# Patient Record
Sex: Female | Born: 1980 | Race: White | Hispanic: No | Marital: Single | Smoking: Former smoker
Health system: Southern US, Community
[De-identification: ages and names within clinical notes are randomized; demographics above are authoritative.]

## PROBLEM LIST (undated history)

## (undated) DIAGNOSIS — Z789 Other specified health status: Secondary | ICD-10-CM

## (undated) HISTORY — PX: LEEP: SHX91

---

## 2018-09-19 ENCOUNTER — Other Ambulatory Visit: Payer: Self-pay

## 2018-09-19 ENCOUNTER — Emergency Department
Admission: EM | Admit: 2018-09-19 | Discharge: 2018-09-19 | Disposition: A | Discharge status: Home / Self Care | Payer: Medicaid Other | Attending: Student in an Organized Health Care Education/Training Program | Admitting: Student in an Organized Health Care Education/Training Program

## 2018-09-19 ENCOUNTER — Encounter: Payer: Self-pay | Admitting: Emergency Medicine

## 2018-09-19 DIAGNOSIS — K648 Other hemorrhoids: Secondary | ICD-10-CM | POA: Diagnosis not present

## 2018-09-19 DIAGNOSIS — F17200 Nicotine dependence, unspecified, uncomplicated: Secondary | ICD-10-CM | POA: Diagnosis not present

## 2018-09-19 DIAGNOSIS — K625 Hemorrhage of anus and rectum: Secondary | ICD-10-CM | POA: Diagnosis not present

## 2018-09-19 DIAGNOSIS — K6289 Other specified diseases of anus and rectum: Secondary | ICD-10-CM | POA: Diagnosis present

## 2018-09-19 MED ORDER — HYDROCORTISONE ACETATE 25 MG RE SUPP: 25.0000 mg | Freq: Two times a day (BID) | RECTAL | 0 refills | Status: DC

## 2018-09-19 MED ORDER — LIDOCAINE HCL URETHRAL/MUCOSAL 2 % EX GEL: 1.0000 "application " | CUTANEOUS | 0 refills | Status: AC | PRN

## 2018-09-19 NOTE — ED Provider Notes (Signed)
[facility] Emergency Department Provider Note ____________________________________________   First MD Initiated Contact with Patient 09/19/18 0720     (approximate)  I have reviewed the triage vital signs and the nursing notes.   HISTORY  Chief Complaint Rectal Pain  HPI Molly Becker is a 38 y.o. female who presents to the emergency department for treatment and evaluation of rectal pain.  Patient states that she has a history of external hemorrhoids and has used over-the-counter medications without relief.  Last bowel movement was yesterday.  Pain increases with bowel movement.  She does notice some streaks of blood on the toilet tissue when wiping.  Pain is preventing her from being able to sleep.         History reviewed. No pertinent past medical history.  There are no active problems to display for this patient.   Past Surgical History:  Procedure Laterality Date  . LEEP      Prior to Admission medications   Medication Sig Start Date End Date Taking? Authorizing Provider  hydrocortisone (ANUSOL-HC) 25 MG suppository Place 1 suppository (25 mg total) rectally 2 (two) times daily. 09/19/18   Sereena Marando B, FNP  lidocaine (XYLOCAINE) 2 % jelly Apply 1 application topically as needed for up to 14 days. 09/19/18 10/03/18  Kem Boroughsriplett, Keri Tavella B, FNP    Allergies Penicillins  No family history on file.  Social History Social History   Tobacco Use  . Smoking status: Current Every Day Smoker  . Smokeless tobacco: Never Used  Substance Use Topics  . Alcohol use: Not Currently    Frequency: Never  . Drug use: Not on file    Review of Systems  Constitutional: No fever/chills Eyes: No visual changes. ENT: No sore throat. Cardiovascular: Denies chest pain. Respiratory: Denies shortness of breath. Gastrointestinal: No abdominal pain.  No nausea, no vomiting.  No diarrhea.  No constipation.  Positive for rectal pain Genitourinary: Negative for  dysuria. Musculoskeletal: Negative for back pain. Skin: Negative for rash. Neurological: Negative for headaches, focal weakness or numbness. ____________________________________________   PHYSICAL EXAM:  VITAL SIGNS: ED Triage Vitals [09/19/18 0714]  Enc Vitals Group     BP 124/78     Pulse Rate 92     Resp 14     Temp 98 F (36.7 C)     Temp Source Oral     SpO2 97 %     Weight      Height      Head Circumference      Peak Flow      Pain Score 9     Pain Loc      Pain Edu?      Excl. in GC?     Constitutional: Alert and oriented. Well appearing and in no acute distress. Eyes: Conjunctivae are normal. Head: Atraumatic. Nose: No congestion/rhinnorhea. Mouth/Throat: Mucous membranes are moist.  Oropharynx non-erythematous. Neck: No stridor.   Cardiovascular:  Good peripheral circulation. Respiratory: Normal respiratory effort.  No retractions. Gastrointestinal: Soft and nontender. No distention. No abdominal bruits. No CVA tenderness.  Internal hemorrhoids noted on DRE.  No blood. Genitourinary:  Musculoskeletal: No lower extremity tenderness nor edema.  No joint effusions. Neurologic:  Normal speech and language. No gross focal neurologic deficits are appreciated. No gait instability. Skin:  Skin is warm, dry and intact. No rash noted. Psychiatric: Mood and affect are normal. Speech and behavior are normal.  ____________________________________________   LABS (all labs ordered are listed, but only abnormal results  are displayed)  Labs Reviewed - No data to display ____________________________________________  EKG  Not indicated ____________________________________________  RADIOLOGY  ED MD interpretation:    Not indicated  Official radiology report(s): No results found.  ____________________________________________   PROCEDURES  Procedure(s) performed (including Critical Care):  Procedures  ____________________________________________    INITIAL IMPRESSION / ASSESSMENT AND PLAN     38 year old female presenting to the emergency department for treatment and evaluation of internal hemorrhoids.  She will be given a prescription for Anusol HC and lidocaine gel.  She was encouraged to follow-up with her primary care provider or see gastroenterology for symptoms that are not improving with medication.  She was also advised that she should take MiraLAX or Metamucil daily.  She was advised to return to the emergency department for symptoms of concern if unable to schedule an appointment with either of the above.  ____________________________________________   FINAL CLINICAL IMPRESSION(S) / ED DIAGNOSES  Final diagnoses:  Internal hemorrhoids     ED Discharge Orders         Ordered    hydrocortisone (ANUSOL-HC) 25 MG suppository  2 times daily     09/19/18 0807    lidocaine (XYLOCAINE) 2 % jelly  As needed     09/19/18 8110           Note:  This document was prepared using Dragon voice recognition software and may include unintentional dictation errors.   Victorino Dike, FNP 09/19/18 3159    Merlyn Lot, MD 09/19/18 1428

## 2018-09-19 NOTE — Discharge Instructions (Signed)
Please follow up with gastroenterologist or primary care for symptoms that are not improving with time and medications.  Use MiraLax or Metamucil daily.  Return to the ER for symptoms that change or worsen if unable to see the specialist or PCP.

## 2018-09-19 NOTE — ED Notes (Signed)
See triage note  States she developed rectal pain for several days  Became worse over the past 4 days

## 2018-09-19 NOTE — ED Triage Notes (Addendum)
Says she has pain inside rectum.  Has had hemorrhoids, and she has been having a hard time going to the bathroom for quite a while.  Pain increased with bowel movment.  Has tried prep h and a&d ointment

## 2018-11-20 ENCOUNTER — Other Ambulatory Visit: Payer: Self-pay

## 2018-11-20 DIAGNOSIS — Z20822 Contact with and (suspected) exposure to covid-19: Secondary | ICD-10-CM

## 2018-11-22 LAB — NOVEL CORONAVIRUS, NAA: SARS-CoV-2, NAA: DETECTED — AB

## 2019-04-22 ENCOUNTER — Other Ambulatory Visit: Payer: Self-pay

## 2019-04-22 ENCOUNTER — Emergency Department: Payer: No Typology Code available for payment source

## 2019-04-22 ENCOUNTER — Emergency Department
Admission: EM | Admit: 2019-04-22 | Discharge: 2019-04-22 | Disposition: A | Discharge status: Home / Self Care | Payer: No Typology Code available for payment source | Attending: Emergency Medicine | Admitting: Emergency Medicine

## 2019-04-22 DIAGNOSIS — Z79899 Other long term (current) drug therapy: Secondary | ICD-10-CM | POA: Diagnosis not present

## 2019-04-22 DIAGNOSIS — Y990 Civilian activity done for income or pay: Secondary | ICD-10-CM | POA: Insufficient documentation

## 2019-04-22 DIAGNOSIS — Y92511 Restaurant or cafe as the place of occurrence of the external cause: Secondary | ICD-10-CM | POA: Insufficient documentation

## 2019-04-22 DIAGNOSIS — S8002XA Contusion of left knee, initial encounter: Secondary | ICD-10-CM | POA: Diagnosis not present

## 2019-04-22 DIAGNOSIS — F17210 Nicotine dependence, cigarettes, uncomplicated: Secondary | ICD-10-CM | POA: Diagnosis not present

## 2019-04-22 DIAGNOSIS — Z23 Encounter for immunization: Secondary | ICD-10-CM | POA: Diagnosis not present

## 2019-04-22 DIAGNOSIS — W19XXXA Unspecified fall, initial encounter: Secondary | ICD-10-CM

## 2019-04-22 DIAGNOSIS — Y939 Activity, unspecified: Secondary | ICD-10-CM | POA: Insufficient documentation

## 2019-04-22 DIAGNOSIS — W010XXA Fall on same level from slipping, tripping and stumbling without subsequent striking against object, initial encounter: Secondary | ICD-10-CM | POA: Insufficient documentation

## 2019-04-22 DIAGNOSIS — S80912A Unspecified superficial injury of left knee, initial encounter: Secondary | ICD-10-CM | POA: Diagnosis present

## 2019-04-22 MED ORDER — BACITRACIN ZINC 500 UNIT/GM EX OINT: TOPICAL_OINTMENT | Freq: Once | CUTANEOUS | Status: AC

## 2019-04-22 MED ORDER — MELOXICAM 15 MG PO TABS: 15.0000 mg | ORAL_TABLET | Freq: Every day | ORAL | 2 refills | Status: DC

## 2019-04-22 MED ORDER — TETANUS-DIPHTH-ACELL PERTUSSIS 5-2.5-18.5 LF-MCG/0.5 IM SUSP
0.5000 mL | Freq: Once | INTRAMUSCULAR | Status: AC
  Administered 2019-04-22: 0.5 mL via INTRAMUSCULAR
  Filled 2019-04-22: qty 0.5

## 2019-04-22 NOTE — ED Triage Notes (Signed)
Pt to ED POV for chief complaint of fall on 3/14. 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 States she skinned her left knee. No swelling noted to knee at this time.  Pt states it "feels like something is loose in there" Pt will be filing workers comp.

## 2019-04-22 NOTE — Discharge Instructions (Signed)
Keep the abrasion clean and dry Wear the knee immobilizer until evaluated by orthopedics Apply ice to the left knee Take meloxicam as prescribed Return emergency department if worsening Follow-up with orthopedics by calling for an appointment.  Talk with your HR representative to make sure your Worker's Comp. will approve this physician.  If not they should refer you to another orthopedic doctor.

## 2019-04-22 NOTE — ED Provider Notes (Signed)
[facility] Emergency Department Provider Note  ____________________________________________   First MD Initiated Contact with Patient 04/22/19 1757     (approximate)  I have reviewed the triage vital signs and the nursing notes.   HISTORY  Chief Complaint Fall    HPI Molly Becker is a 39 y.o. female presents emergency department complaining of left knee pain.  Patient states she was at work at [employer] and fell on 04/20/2019.  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  She was trying to catch a bus cart which also flipped over and she fell skinning her left knee.  Complaining of continued pain to the left knee and "feels like something is loose inside ".  She states it was difficult to finish her shift today due to the amount of pain.  She denies any numbness or tingling.  No other injuries reported.    History reviewed. No pertinent past medical history.  There are no problems to display for this patient.   Past Surgical History:  Procedure Laterality Date  . LEEP      Prior to Admission medications   Medication Sig Start Date End Date Taking? Authorizing Provider  hydrocortisone (ANUSOL-HC) 25 MG suppository Place 1 suppository (25 mg total) rectally 2 (two) times daily. 09/19/18   Triplett, Johnette Abraham B, FNP  meloxicam (MOBIC) 15 MG tablet Take 1 tablet (15 mg total) by mouth daily. 04/22/19 04/21/20  Versie Starks, PA-C    Allergies Penicillins  History reviewed. No pertinent family history.  Social History Social History   Tobacco Use  . Smoking status: Current Every Day Smoker  . Smokeless tobacco: Never Used  Substance Use Topics  . Alcohol use: Not Currently  . Drug use: Not on file    Review of Systems  Constitutional: No fever/chills Eyes: No visual changes. ENT: No sore throat. Respiratory: Denies cough Cardiovascular: Denies chest pain Gastrointestinal: Denies abdominal pain Genitourinary: Negative for  dysuria. Musculoskeletal: Negative for back pain.  Positive for left knee pain Skin: Negative for rash.  Positive for abrasion to the left knee Psychiatric: no mood changes,     ____________________________________________   PHYSICAL EXAM:  VITAL SIGNS: ED Triage Vitals  Enc Vitals Group     BP 04/22/19 1755 129/83     Pulse Rate 04/22/19 1755 90     Resp 04/22/19 1755 18     Temp --      Temp src --      SpO2 04/22/19 1755 98 %     Weight 04/22/19 1756 230 lb (104.3 kg)     Height 04/22/19 1756 5\' 11"  (1.803 m)     Head Circumference --      Peak Flow --      Pain Score 04/22/19 1756 7     Pain Loc --      Pain Edu? --      Excl. in [place]? --     Constitutional: Alert and oriented. Well appearing and in no acute distress. Eyes: Conjunctivae are normal.  Head: Atraumatic. Nose: No congestion/rhinnorhea. Mouth/Throat: Mucous membranes are moist.   Neck:  supple no lymphadenopathy noted Cardiovascular: Normal rate, regular rhythm.  Respiratory: Normal respiratory effort.  No retractions,  GU: deferred Musculoskeletal: FROM all extremities, warm and well perfused, patient is able to ambulate but does limp, left knee is tender at the joint line, large abrasion noted at the patella, no foreign body noted, ligaments appear to be intact, patient is guarding so is difficult  to tell.  No posterior knee tenderness. Neurologic:  Normal speech and language.  Skin:  Skin is warm, dry, multiple abrasions to the left knee, no rash noted. Psychiatric: Mood and affect are normal. Speech and behavior are normal.  ____________________________________________   LABS (all labs ordered are listed, but only abnormal results are displayed)  Labs Reviewed - No data to display ____________________________________________   ____________________________________________  RADIOLOGY  X-ray of the left knee is  negative  ____________________________________________   PROCEDURES  Procedure(s) performed: Knee immobilizer and crutches   Procedures    ____________________________________________   INITIAL IMPRESSION / ASSESSMENT AND PLAN / ED COURSE  Pertinent labs & imaging results that were available during my care of the patient were reviewed by me and considered in my medical decision making (see chart for details).   Patient's 39 year old female presents emergency department complaining of a left knee injury fall at work.  See HPI  Physical exam shows patient to have some left knee tenderness along with a large abrasion noted along the patella and joint line.  X-ray of the left knee Tdap updated   X-ray left knee is negative for fracture.  Explained findings to the patient.  Do feel that she needs to be in a knee immobilizer for 1 to 2 weeks.  If after that she continues to have knee pain she will need to be evaluated by orthopedics for possible meniscus tear.  She was given a prescription for meloxicam.  She is to follow-up with critical clinic orthopedics unless her Worker's Comp. does not approve this physician.  Worker's Comp. should refer her to a physician of their choice.  Return emergency department worsening.  Apply ice to the knee.  Light duty was given.  She was discharged stable condition.  Molly Becker was evaluated in Emergency Department on 04/22/2019 for the symptoms described in the history of present illness. She was evaluated in the context of the global COVID-19 pandemic, which necessitated consideration that the patient might be at risk for infection with the SARS-CoV-2 virus that causes COVID-19. Institutional protocols and algorithms that pertain to the evaluation of patients at risk for COVID-19 are in a state of rapid change based on information released by regulatory bodies including the CDC and federal and state organizations. These policies and algorithms were  followed during the patient's care in the ED.   As part of my medical decision making, I reviewed the following data within the electronic MEDICAL RECORD NUMBER Nursing notes reviewed and incorporated, Old chart reviewed, Radiograph reviewed , Notes from prior ED visits and [place] Controlled Substance Database  ____________________________________________   FINAL CLINICAL IMPRESSION(S) / ED DIAGNOSES  Final diagnoses:  Fall, initial encounter  Contusion of left knee, initial encounter      NEW MEDICATIONS STARTED DURING THIS VISIT:  New Prescriptions   MELOXICAM (MOBIC) 15 MG TABLET    Take 1 tablet (15 mg total) by mouth daily.     Note:  This document was prepared using Dragon voice recognition software and may include unintentional dictation errors.    Faythe Ghee, PA-C 04/22/19 1857    Darci Current, MD 04/23/19 2053

## 2019-05-02 ENCOUNTER — Ambulatory Visit: Payer: Medicaid Other | Admitting: Dermatology

## 2019-07-03 ENCOUNTER — Ambulatory Visit: Payer: Medicaid Other | Admitting: Dermatology

## 2020-03-23 ENCOUNTER — Other Ambulatory Visit: Payer: Self-pay | Admitting: Family Medicine

## 2020-03-23 DIAGNOSIS — Z1231 Encounter for screening mammogram for malignant neoplasm of breast: Secondary | ICD-10-CM

## 2020-03-29 ENCOUNTER — Ambulatory Visit (INDEPENDENT_AMBULATORY_CARE_PROVIDER_SITE_OTHER): Payer: Medicaid Other

## 2020-03-29 ENCOUNTER — Encounter: Payer: Self-pay | Admitting: Podiatry

## 2020-03-29 ENCOUNTER — Ambulatory Visit (INDEPENDENT_AMBULATORY_CARE_PROVIDER_SITE_OTHER): Payer: Medicaid Other | Admitting: Podiatry

## 2020-03-29 ENCOUNTER — Other Ambulatory Visit: Payer: Self-pay

## 2020-03-29 DIAGNOSIS — S91331A Puncture wound without foreign body, right foot, initial encounter: Secondary | ICD-10-CM

## 2020-03-29 DIAGNOSIS — L603 Nail dystrophy: Secondary | ICD-10-CM

## 2020-03-29 DIAGNOSIS — Q828 Other specified congenital malformations of skin: Secondary | ICD-10-CM | POA: Diagnosis not present

## 2020-03-29 NOTE — Progress Notes (Signed)
  Subjective:  Patient ID: Molly Becker, female    DOB: Nov 22, 1980,  MRN: [id number]  Chief Complaint  Patient presents with  . Callouses  . Nail Problem    Patient presents today for nail fungus bilat great toenails and 2nd nails x years and painful callous lesion bottom of left 1st met.    40 y.o. female presents with the above complaint. History confirmed with patient.  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  Had some recurrent infections and drain pus for a while but finally closed up.  Has been painful since.  She also previously was on Lamisil for nail fungus but stopped due to issues with her alcohol use at the time.  She does not recall exactly how long she was taking it but says it was probably not long enough.  She usually has acrylic nails put on to cover the dystrophic toenails  Objective:  Physical Exam: warm, good capillary refill, no trophic changes or ulcerative lesions, normal DP and PT pulses and normal sensory exam.  Bilaterally with right worse than left she has dystrophic first and second toenails.  Thickening and subungual debris noted.  Submet 1 on the right foot there is hyperkeratosis without evidence of sinus tract or puncture wound.  No retained foreign body visible.  Radiographs: X-ray of the right foot: Skin marker present in the site of concern, there is no retained foreign body or osseous abnormality Assessment:   1. Puncture wound of plantar aspect of right foot, initial encounter   2. Porokeratosis   3. Nail dystrophy      Plan:  Patient was evaluated and treated and all questions answered.  All symptomatic hyperkeratoses were safely debrided with a sterile #15 blade to patient's level of comfort without incident. We discussed preventative and palliative care of these lesions including supportive and accommodative shoegear, padding, prefabricated and custom molded  accommodative orthoses, use of a pumice stone and lotions/creams daily.  Recommended she use urea cream and or salicylic acid.  She will use a pumice stone daily.  Aperture pads recommended as well and she will get these.  For the nail dystrophy I took a small sample of the nail and we will send this for nail culture.  If it comes back with fungus we will treat accordingly.  Discussed that if the nail dystrophy is permanent and are not able to treat it that permanent nail avulsion would be an option or she can continue to put on acrylics to cover it..    Return in about 6 weeks (around 05/10/2020).

## 2020-03-29 NOTE — Patient Instructions (Signed)
Look for urea 40% cream or ointment and apply to the thickened dry skin / calluses. This can be bought over the counter, at a pharmacy or online such as [employer].  Salicylic acid also works well  Aperture pads can be found on [employer] to take pressure off the hard spot

## 2020-04-13 ENCOUNTER — Encounter: Payer: Self-pay | Admitting: *Deleted

## 2020-05-03 ENCOUNTER — Ambulatory Visit
Admission: RE | Admit: 2020-05-03 | Discharge: 2020-05-03 | Disposition: A | Discharge status: Home / Self Care | Payer: Medicaid Other | Source: Ambulatory Visit | Attending: Family Medicine | Admitting: Family Medicine

## 2020-05-03 ENCOUNTER — Other Ambulatory Visit: Payer: Self-pay

## 2020-05-03 DIAGNOSIS — Z1231 Encounter for screening mammogram for malignant neoplasm of breast: Secondary | ICD-10-CM | POA: Diagnosis present

## 2020-05-10 ENCOUNTER — Ambulatory Visit: Payer: Medicaid Other | Admitting: Podiatry

## 2020-05-24 ENCOUNTER — Ambulatory Visit: Payer: Medicaid Other | Admitting: Podiatry

## 2021-01-10 ENCOUNTER — Ambulatory Visit: Payer: Medicaid Other

## 2021-04-11 ENCOUNTER — Other Ambulatory Visit: Payer: Self-pay | Admitting: Family Medicine

## 2021-04-11 DIAGNOSIS — Z1231 Encounter for screening mammogram for malignant neoplasm of breast: Secondary | ICD-10-CM

## 2021-05-23 ENCOUNTER — Ambulatory Visit
Admission: RE | Admit: 2021-05-23 | Discharge: 2021-05-23 | Disposition: A | Discharge status: Home / Self Care | Payer: Medicaid Other | Source: Ambulatory Visit | Attending: Family Medicine | Admitting: Family Medicine

## 2021-05-23 DIAGNOSIS — Z1231 Encounter for screening mammogram for malignant neoplasm of breast: Secondary | ICD-10-CM | POA: Diagnosis present

## 2022-01-19 ENCOUNTER — Emergency Department
Admission: EM | Admit: 2022-01-19 | Discharge: 2022-01-19 | Disposition: A | Discharge status: Home / Self Care | Payer: Medicaid Other | Attending: Student in an Organized Health Care Education/Training Program | Admitting: Student in an Organized Health Care Education/Training Program

## 2022-01-19 ENCOUNTER — Other Ambulatory Visit: Payer: Self-pay

## 2022-01-19 DIAGNOSIS — R42 Dizziness and giddiness: Secondary | ICD-10-CM | POA: Insufficient documentation

## 2022-01-19 DIAGNOSIS — R109 Unspecified abdominal pain: Secondary | ICD-10-CM | POA: Diagnosis not present

## 2022-01-19 DIAGNOSIS — R1110 Vomiting, unspecified: Secondary | ICD-10-CM | POA: Insufficient documentation

## 2022-01-19 DIAGNOSIS — R197 Diarrhea, unspecified: Secondary | ICD-10-CM | POA: Diagnosis present

## 2022-01-19 LAB — COMPREHENSIVE METABOLIC PANEL
ALT: 27 U/L (ref 0–44)
AST: 20 U/L (ref 15–41)
Albumin: 3.8 g/dL (ref 3.5–5.0)
Alkaline Phosphatase: 57 U/L (ref 38–126)
Anion gap: 6 (ref 5–15)
BUN: 11 mg/dL (ref 6–20)
CO2: 25 mmol/L (ref 22–32)
Calcium: 9.8 mg/dL (ref 8.9–10.3)
Chloride: 108 mmol/L (ref 98–111)
Creatinine, Ser: 0.71 mg/dL (ref 0.44–1.00)
GFR, Estimated: >60 mL/min (ref >60)
Glucose, Bld: 104 mg/dL — ABNORMAL HIGH (ref 70–99)
Potassium: 3.8 mmol/L (ref 3.5–5.1)
Sodium: 139 mmol/L (ref 135–145)
Total Bilirubin: 0.5 mg/dL (ref 0.3–1.2)
Total Protein: 7.5 g/dL (ref 6.5–8.1)

## 2022-01-19 LAB — LIPASE, BLOOD: Lipase: 49 U/L (ref 11–51)

## 2022-01-19 LAB — URINALYSIS, ROUTINE W REFLEX MICROSCOPIC
Bilirubin Urine: NEGATIVE
Glucose, UA: NEGATIVE mg/dL
Hgb urine dipstick: NEGATIVE
Ketones, ur: NEGATIVE mg/dL
Leukocytes,Ua: NEGATIVE
Nitrite: NEGATIVE
Protein, ur: NEGATIVE mg/dL
Specific Gravity, Urine: 1.02 (ref 1.005–1.030)
pH: 5 (ref 5.0–8.0)

## 2022-01-19 LAB — CBC
HCT: 41.7 % (ref 36.0–46.0)
Hemoglobin: 13.2 g/dL (ref 12.0–15.0)
MCH: 29.3 pg (ref 26.0–34.0)
MCHC: 31.7 g/dL (ref 30.0–36.0)
MCV: 92.7 fL (ref 80.0–100.0)
Platelets: 328 10*3/uL (ref 150–400)
RBC: 4.5 MIL/uL (ref 3.87–5.11)
RDW: 13.7 % (ref 11.5–15.5)
WBC: 6.9 10*3/uL (ref 4.0–10.5)
nRBC: 0 % (ref 0.0–0.2)

## 2022-01-19 LAB — POC URINE PREG, ED: Preg Test, Ur: NEGATIVE

## 2022-01-19 MED ORDER — LOPERAMIDE HCL 2 MG PO TABS: 2.0000 mg | ORAL_TABLET | Freq: Four times a day (QID) | ORAL | 0 refills | Status: DC | PRN

## 2022-01-19 MED ORDER — ONDANSETRON 4 MG PO TBDP: 4.0000 mg | ORAL_TABLET | Freq: Three times a day (TID) | ORAL | 0 refills | Status: DC | PRN

## 2022-01-19 MED ORDER — DICYCLOMINE HCL 10 MG PO CAPS: 10.0000 mg | ORAL_CAPSULE | Freq: Three times a day (TID) | ORAL | 0 refills | Status: DC

## 2022-01-19 NOTE — ED Provider Notes (Signed)
[facility] Provider Note  Patient Contact: 5:53 PM (approximate)   History   Abdominal Pain   HPI  Molly Becker is a 41 y.o. female who presents emergency department complaining of abdominal cramping, diarrhea.  Patient states that she has been having diarrhea for 2 days.  She states that she will get abdominal cramping right before the episodes of diarrhea.  During abdominal cramping and diarrhea she states that she gets lightheaded feeling when she is going to pass out.  She has not had any syncope.  States that since she has diarrhea her abdomen feels better, lightheaded sensation goes away and she feels "normal.  "No fevers or chills.  She is not nauseated several times with diarrhea and 1 episode of emesis.  No blood in the diarrhea.  No blood in the vomit.  Patient has no chronic GI complaints.  She states that she has no abdominal pain other than the cramping sensation when she is having the actual episodes of diarrhea on the commode.     Physical Exam   Triage Vital Signs: ED Triage Vitals  Enc Vitals Group     BP 01/19/22 1625 124/79     Pulse Rate 01/19/22 1620 87     Resp 01/19/22 1620 16     Temp 01/19/22 1620 97.6 F (36.4 C)     Temp Source 01/19/22 1620 Oral     SpO2 01/19/22 1620 100 %     Weight 01/19/22 1623 250 lb (113.4 kg)     Height 01/19/22 1623 5\' 11"  (1.803 m)     Head Circumference --      Peak Flow --      Pain Score 01/19/22 1622 7     Pain Loc --      Pain Edu? --      Excl. in GC? --     Most recent vital signs: Vitals:   01/19/22 1620 01/19/22 1625  BP:  124/79  Pulse: 87   Resp: 16   Temp: 97.6 F (36.4 C)   SpO2: 100%      General: Alert and in no acute distress.  Cardiovascular:  Good peripheral perfusion Respiratory: Normal respiratory effort without tachypnea or retractions. Lungs CTAB.  Gastrointestinal: Bowel sounds 4 quadrants. Soft and nontender to palpation. No guarding or rigidity. No palpable  masses. No distention. No CVA tenderness. Musculoskeletal: Full range of motion to all extremities.  Neurologic:  No gross focal neurologic deficits are appreciated.  Skin:   No rash noted Other:   ED Results / Procedures / Treatments   Labs (all labs ordered are listed, but only abnormal results are displayed) Labs Reviewed  COMPREHENSIVE METABOLIC PANEL - Abnormal; Notable for the following components:      Result Value   Glucose, Bld 104 (*)    All other components within normal limits  URINALYSIS, ROUTINE W REFLEX MICROSCOPIC - Abnormal; Notable for the following components:   Color, Urine YELLOW (*)    APPearance HAZY (*)    All other components within normal limits  LIPASE, BLOOD  CBC  POC URINE PREG, ED     EKG     RADIOLOGY    No results found.  PROCEDURES:  Critical Care performed: No  Procedures   MEDICATIONS ORDERED IN ED: Medications - No data to display   IMPRESSION / MDM / ASSESSMENT AND PLAN / ED COURSE  I reviewed the triage vital signs and the nursing notes.  Differential diagnosis includes, but is not limited to, viral gastroenteritis, flu, presents, colitis, cholecystitis, parotitis, UTI  Patient's presentation is most consistent with acute presentation with potential threat to life or bodily function.   Patient's diagnosis is consistent with diarrhea, gastroenteritis.  Patient presents emergency department with ongoing diarrhea x 2 days.  She states that she gets abdominal cramping with the diarrhea but is not having pain at baseline.  There is been no fevers.  She had 1 episode of emesis while she was having diarrhea.  No ongoing emesis.  Patient has a reassuring physical exam with no tenderness.  Suspect viral gastroenteritis.  Labs are reassuring with no elevation white blood cell count, lipase is reassuring.  Urinalysis is reassuring.  At this time I have offered imaging but patient prefers to control  medication and states that she will return if symptoms worsen.  I feel this is reasonable given benign exam, reassuring labs and the fact that patient is only experiencing symptoms during her periods of diarrhea.  Short-term cautions given.  Patient also controlled medications prescribed at this time.  Follow-up primary care as needed..  Patient is given ED precautions to return to the ED for any worsening or new symptoms.        FINAL CLINICAL IMPRESSION(S) / ED DIAGNOSES   Final diagnoses:  Diarrhea of presumed infectious origin     Rx / DC Orders   ED Discharge Orders          Ordered    ondansetron (ZOFRAN-ODT) 4 MG disintegrating tablet  Every 8 hours PRN        01/19/22 1758    dicyclomine (BENTYL) 10 MG capsule  3 times daily before meals & bedtime        01/19/22 1758    loperamide (IMODIUM A-D) 2 MG tablet  4 times daily PRN        01/19/22 1758             Note:  This document was prepared using Dragon voice recognition software and may include unintentional dictation errors.   Lanette Hampshire 01/19/22 1759    Willy Eddy, MD 01/19/22 1929

## 2022-01-19 NOTE — ED Triage Notes (Signed)
Pt states woke up Tuesday morning around 0500 started having diarrhea, then felt nauseated and broke out in a cold sweat. Pt states felt like she was going to pass out. Has been ongoing since. Pt states some abdominal cramping pain.

## 2022-03-14 ENCOUNTER — Ambulatory Visit
Admission: EM | Admit: 2022-03-14 | Discharge: 2022-03-14 | Disposition: A | Discharge status: Home / Self Care | Payer: Medicaid Other | Attending: Emergency Medicine | Admitting: Emergency Medicine

## 2022-03-14 DIAGNOSIS — J0100 Acute maxillary sinusitis, unspecified: Secondary | ICD-10-CM | POA: Diagnosis not present

## 2022-03-14 MED ORDER — CLARITHROMYCIN 500 MG PO TABS: 500.0000 mg | ORAL_TABLET | Freq: Two times a day (BID) | ORAL | 0 refills | Status: AC

## 2022-03-14 NOTE — Discharge Instructions (Signed)
Take the antibiotic as directed.  Follow up with your primary care provider if your symptoms are not improving.     

## 2022-03-14 NOTE — ED Provider Notes (Signed)
Roderic Palau    CSN: [id number] Arrival date & time: 03/14/22  1142      History   Chief Complaint Chief Complaint  Patient presents with   Eye Problem    HPI Molly Becker is a 42 y.o. female.  Patient presents with 1 week history of fatigue, headache, sinus pressure, congestion, runny nose, cough, yellow nasal mucous, watery eyes.  Treatment with Dayquil, Nyquil, allergy medication.  No fever, rash, sore throat, shortness of breath, vomiting, diarrhea, or other symptoms.  No pertinent medical history.   The history is provided by the patient and medical records.    History reviewed. No pertinent past medical history.  There are no problems to display for this patient.   Past Surgical History:  Procedure Laterality Date   LEEP      OB History   No obstetric history on file.      Home Medications    Prior to Admission medications   Medication Sig Start Date End Date Taking? Authorizing Provider  clarithromycin (BIAXIN) 500 MG tablet Take 1 tablet (500 mg total) by mouth 2 (two) times daily for 10 days. 03/14/22 03/24/22 Yes Sharion Balloon, NP  dicyclomine (BENTYL) 10 MG capsule Take 1 capsule (10 mg total) by mouth 4 (four) times daily -  before meals and at bedtime. 01/19/22   Cuthriell, Charline Bills, PA-C  loperamide (IMODIUM A-D) 2 MG tablet Take 1 tablet (2 mg total) by mouth 4 (four) times daily as needed for diarrhea or loose stools. 01/19/22   Cuthriell, Charline Bills, PA-C  ondansetron (ZOFRAN-ODT) 4 MG disintegrating tablet Take 1 tablet (4 mg total) by mouth every 8 (eight) hours as needed. 01/19/22   Cuthriell, Charline Bills, PA-C  phentermine 37.5 MG capsule Take 37.5 mg by mouth every morning. 03/22/20   [provider]    Family History Family History  Problem Relation Age of Onset   Breast cancer Neg Hx     Social History Social History   Tobacco Use   Smoking status: Former    Types: Cigarettes   Smokeless tobacco: Never  Vaping Use    Vaping Use: Never used  Substance Use Topics   Alcohol use: Not Currently     Allergies   Penicillins   Review of Systems Review of Systems  Constitutional:  Positive for fatigue. Negative for chills and fever.  HENT:  Positive for congestion, postnasal drip, rhinorrhea and sinus pressure. Negative for ear pain and sore throat.   Respiratory:  Positive for cough. Negative for shortness of breath.   Cardiovascular:  Negative for chest pain and palpitations.  Gastrointestinal:  Negative for diarrhea and vomiting.  Skin:  Negative for color change and rash.  Neurological:  Positive for headaches.  All other systems reviewed and are negative.    Physical Exam Triage Vital Signs ED Triage Vitals [03/14/22 1218]  Enc Vitals Group     BP      Pulse Rate 83     Resp 18     Temp 97.7 F (36.5 C)     Temp src      SpO2 97 %     Weight      Height      Head Circumference      Peak Flow      Pain Score      Pain Loc      Pain Edu?      Excl. in [place]?    No data found.  Updated Vital Signs BP 119/78   Pulse 83   Temp 97.7 F (36.5 C)   Resp 18   LMP 02/13/2022   SpO2 97%   Visual Acuity Right Eye Distance:   Left Eye Distance:   Bilateral Distance:    Right Eye Near:   Left Eye Near:    Bilateral Near:     Physical Exam Vitals and nursing note reviewed.  Constitutional:      General: She is not in acute distress.    Appearance: She is well-developed. She is not ill-appearing.  HENT:     Right Ear: Tympanic membrane normal.     Left Ear: Tympanic membrane normal.     Nose: Congestion and rhinorrhea present.     Mouth/Throat:     Mouth: Mucous membranes are moist.     Pharynx: Oropharynx is clear.  Eyes:     Extraocular Movements: Extraocular movements intact.     Conjunctiva/sclera: Conjunctivae normal.     Pupils: Pupils are equal, round, and reactive to light.  Cardiovascular:     Rate and Rhythm: Normal rate and regular rhythm.     Heart sounds:  Normal heart sounds.  Pulmonary:     Effort: Pulmonary effort is normal. No respiratory distress.     Breath sounds: Normal breath sounds.  Musculoskeletal:     Cervical back: Neck supple.  Skin:    General: Skin is warm and dry.  Neurological:     Mental Status: She is alert.  Psychiatric:        Mood and Affect: Mood normal.        Behavior: Behavior normal.      UC Treatments / Results  Labs (all labs ordered are listed, but only abnormal results are displayed) Labs Reviewed - No data to display  EKG   Radiology No results found.  Procedures Procedures (including critical care time)  Medications Ordered in UC Medications - No data to display  Initial Impression / Assessment and Plan / UC Course  I have reviewed the triage vital signs and the nursing notes.  Pertinent labs & imaging results that were available during my care of the patient were reviewed by me and considered in my medical decision making (see chart for details).    Acute sinusitis.  Patient has been symptomatic for 1 week and is not improving with OTC treatment.  She has an allergy to PCN; last taken at approximately age 60 and had hives.  Treating today with clarithromycin.  Discussed symptomatic treatment including Tylenol, rest, hydration.  Instructed patient to follow up with her PCP if symptoms are not improving.  She agrees to plan of care.   Final Clinical Impressions(s) / UC Diagnoses   Final diagnoses:  Acute non-recurrent maxillary sinusitis     Discharge Instructions      Take the antibiotic as directed.  Follow up with your primary care provider if your symptoms are not improving.        ED Prescriptions     Medication Sig Dispense Auth. Provider   clarithromycin (BIAXIN) 500 MG tablet Take 1 tablet (500 mg total) by mouth 2 (two) times daily for 10 days. 20 tablet Sharion Balloon, NP      PDMP not reviewed this encounter.   Sharion Balloon, NP 03/14/22 1245

## 2022-03-14 NOTE — ED Triage Notes (Signed)
Patient to Urgent Care with complaints of headache, cough, fatigue/ low energy, watery eyes x1 week.  Denies any known fevers.   Taking nyquil/ dayqil/ allergy medications.

## 2022-04-10 ENCOUNTER — Telehealth: Payer: Self-pay

## 2022-04-10 ENCOUNTER — Other Ambulatory Visit: Payer: Self-pay | Admitting: Family Medicine

## 2022-04-10 DIAGNOSIS — Z1231 Encounter for screening mammogram for malignant neoplasm of breast: Secondary | ICD-10-CM

## 2022-04-10 NOTE — Telephone Encounter (Signed)
Patient left a voicemail about make a appointment from the referral we received from her PCP. Returned patient call and made her a new patient appointment for 06/21/2022

## 2022-05-25 ENCOUNTER — Ambulatory Visit
Admission: RE | Admit: 2022-05-25 | Discharge: 2022-05-25 | Disposition: A | Discharge status: Home / Self Care | Payer: Medicaid Other | Source: Ambulatory Visit | Attending: Family Medicine | Admitting: Family Medicine

## 2022-05-25 DIAGNOSIS — Z1231 Encounter for screening mammogram for malignant neoplasm of breast: Secondary | ICD-10-CM | POA: Diagnosis present

## 2022-06-21 ENCOUNTER — Ambulatory Visit (INDEPENDENT_AMBULATORY_CARE_PROVIDER_SITE_OTHER): Payer: Medicaid Other | Admitting: Gastroenterology

## 2022-06-21 ENCOUNTER — Other Ambulatory Visit: Payer: Self-pay

## 2022-06-21 ENCOUNTER — Encounter: Payer: Self-pay | Admitting: Gastroenterology

## 2022-06-21 VITALS — BP 133/88 | HR 96 | Temp 97.9°F | Ht 71.0 in | Wt 251.8 lb

## 2022-06-21 DIAGNOSIS — K625 Hemorrhage of anus and rectum: Secondary | ICD-10-CM

## 2022-06-21 DIAGNOSIS — K5909 Other constipation: Secondary | ICD-10-CM

## 2022-06-21 MED ORDER — NA SULFATE-K SULFATE-MG SULF 17.5-3.13-1.6 GM/177ML PO SOLN: 354.0000 mL | Freq: Once | ORAL | 0 refills | Status: AC

## 2022-06-21 NOTE — Patient Instructions (Signed)
Gave Linzess 290 samples.

## 2022-06-21 NOTE — Progress Notes (Signed)
Arlyss Repress, MD [address]  [address]  [place], [place] [address]  Main: [phone]  Fax: [phone]    Gastroenterology Consultation  Referring Provider:     Center, [facility]* Primary Care Physician:  Center, [facility] Primary Gastroenterologist:  Dr. Arlyss Repress Reason for Consultation: Chronic constipation, rectal bleeding        HPI:   Molly Becker is a 42 y.o. female referred by Center, [facility]  for consultation & management of chronic constipation, rectal bleeding.  Patient had history of alcohol dependence, has been sober for last 5 years, went to deaddiction program.  She reports that over the last several years, she has been experiencing severe constipation and has been laxative dependent.  She has to take at least 2-3 laxatives in order to have a bowel movement.  She has been taking equal and 2 MiraLAX over-the-counter and has been taking fiber pills daily.  She cannot have a bowel movement if not using laxatives and which can last for more than a week.  Reports abdominal bloating as well.  Does report intermittent bright red blood per rectum.  She states that she is trying to eat healthy, does consume red meat regularly, works at [employer] that her gut issues have started since her second pregnancy, at age of 29  NSAIDs: None  Antiplts/Anticoagulants/Anti thrombotics: None  GI Procedures: None  No past medical history on file.  Past Surgical History:  Procedure Laterality Date   LEEP       Current Outpatient Medications:    Na Sulfate-K Sulfate-Mg Sulf 17.5-3.13-1.6 GM/177ML SOLN, Take 354 mLs by mouth once for 1 dose., Disp: 354 mL, Rfl: 0   Family History  Problem Relation Age of Onset   Breast cancer Neg Hx      Social History   Tobacco Use   Smoking status: Former    Types: Cigarettes   Smokeless tobacco: Never  Vaping Use   Vaping Use: Never used  Substance Use Topics   Alcohol  use: Not Currently    Allergies as of 06/21/2022 - Review Complete 06/21/2022  Allergen Reaction Noted   Penicillins Hives 09/19/2018    Review of Systems:    All systems reviewed and negative except where noted in HPI.   Physical Exam:  BP 133/88   Pulse 96   Temp 97.9 F (36.6 C) (Oral)   Ht 5\' 11"  (1.803 m)   Wt 251 lb 12.8 oz (114.2 kg)   LMP 05/08/2022   BMI 35.12 kg/m  Patient's last menstrual period was 05/08/2022.  General:   Alert,  Well-developed, well-nourished, pleasant and cooperative in NAD Head:  Normocephalic and atraumatic. Eyes:  Sclera clear, no icterus.   Conjunctiva pink. Ears:  Normal auditory acuity. Nose:  No deformity, discharge, or lesions. Mouth:  No deformity or lesions,oropharynx pink & moist. Neck:  Supple; no masses or thyromegaly. Lungs:  Respirations even and unlabored.  Clear throughout to auscultation.   No wheezes, crackles, or rhonchi. No acute distress. Heart:  Regular rate and rhythm; no murmurs, clicks, rubs, or gallops. Abdomen:  Normal bowel sounds. Soft, non-tender and non-distended without masses, hepatosplenomegaly or hernias noted.  No guarding or rebound tenderness.   Rectal: Not performed Msk:  Symmetrical without gross deformities. Good, equal movement & strength bilaterally. Pulses:  Normal pulses noted. Extremities:  No clubbing or edema.  No cyanosis. Neurologic:  Alert and oriented x3;  grossly normal neurologically. Skin:  Intact without significant lesions or rashes. No jaundice. Psych:  Alert and cooperative. Normal mood and affect.  Imaging Studies: None  Assessment and Plan:   Molly Becker is a 42 y.o. female with history of chronic constipation, past history of alcohol dependence, has been sober for last 5 years, rectal bleeding  Chronic constipation Discussed about high-fiber diet, healthy lifestyle, information provided Trial of Linzess 290 mcg daily, samples provided  Rectal bleeding Recommend  colonoscopy with 2-day prep   Follow up with Celso Amy, PA-C in 3 months   Arlyss Repress, MD

## 2022-06-22 ENCOUNTER — Encounter: Payer: Self-pay | Admitting: Gastroenterology

## 2022-06-29 ENCOUNTER — Encounter
Admission: RE | Disposition: A | Discharge status: Home / Self Care | Payer: Self-pay | Source: Home / Self Care | Attending: Gastroenterology

## 2022-06-29 ENCOUNTER — Telehealth: Payer: Self-pay

## 2022-06-29 ENCOUNTER — Ambulatory Visit: Payer: Medicaid Other | Admitting: Anesthesiology

## 2022-06-29 ENCOUNTER — Ambulatory Visit
Admission: RE | Admit: 2022-06-29 | Discharge: 2022-06-29 | Disposition: A | Discharge status: Home / Self Care | Payer: Medicaid Other | Attending: Gastroenterology | Admitting: Gastroenterology

## 2022-06-29 ENCOUNTER — Encounter: Payer: Self-pay | Admitting: Gastroenterology

## 2022-06-29 ENCOUNTER — Other Ambulatory Visit: Payer: Self-pay

## 2022-06-29 DIAGNOSIS — K644 Residual hemorrhoidal skin tags: Secondary | ICD-10-CM | POA: Insufficient documentation

## 2022-06-29 DIAGNOSIS — K625 Hemorrhage of anus and rectum: Secondary | ICD-10-CM | POA: Diagnosis present

## 2022-06-29 DIAGNOSIS — Z87891 Personal history of nicotine dependence: Secondary | ICD-10-CM | POA: Diagnosis not present

## 2022-06-29 HISTORY — PX: COLONOSCOPY WITH PROPOFOL: SHX5780

## 2022-06-29 HISTORY — DX: Other specified health status: Z78.9

## 2022-06-29 LAB — POCT PREGNANCY, URINE: Preg Test, Ur: NEGATIVE

## 2022-06-29 SURGERY — COLONOSCOPY WITH PROPOFOL
Anesthesia: Choice | Site: Rectum

## 2022-06-29 MED ORDER — PROPOFOL 10 MG/ML IV BOLUS
INTRAVENOUS | Status: DC | PRN
  Administered 2022-06-29: 20 mg via INTRAVENOUS
  Administered 2022-06-29: 100 mg via INTRAVENOUS

## 2022-06-29 MED ORDER — SODIUM CHLORIDE 0.9 % IV SOLN: INTRAVENOUS | Status: DC

## 2022-06-29 MED ORDER — LIDOCAINE HCL (CARDIAC) PF 100 MG/5ML IV SOSY
PREFILLED_SYRINGE | INTRAVENOUS | Status: DC | PRN
  Administered 2022-06-29: 20 mg via INTRAVENOUS

## 2022-06-29 MED ORDER — PROPOFOL 500 MG/50ML IV EMUL
INTRAVENOUS | Status: DC | PRN
  Administered 2022-06-29: 125 ug/kg/min via INTRAVENOUS

## 2022-06-29 MED ORDER — LACTATED RINGERS IV SOLN: INTRAVENOUS | Status: DC

## 2022-06-29 MED ORDER — LINACLOTIDE 290 MCG PO CAPS: 290.0000 ug | ORAL_CAPSULE | Freq: Every day | ORAL | 5 refills | Status: AC

## 2022-06-29 MED ORDER — STERILE WATER FOR IRRIGATION IR SOLN
Status: DC | PRN
  Administered 2022-06-29: 50 mL

## 2022-06-29 SURGICAL SUPPLY — 6 items
GOWN CVR UNV OPN BCK APRN NK (MISCELLANEOUS) ×2 IMPLANT
GOWN ISOL THUMB LOOP REG UNIV (MISCELLANEOUS) ×2
KIT PRC NS LF DISP ENDO (KITS) ×1 IMPLANT
KIT PROCEDURE OLYMPUS (KITS) ×1
MANIFOLD NEPTUNE II (INSTRUMENTS) ×1 IMPLANT
WATER STERILE IRR 250ML POUR (IV SOLUTION) ×1 IMPLANT

## 2022-06-29 NOTE — H&P (Signed)
  Arlyss Repress, MD [address]  [address]  [place], [place] [address]  Main: [phone]  Fax: [phone] Pager: [phone]  Primary Care Physician:  Lorn Junes, FNP Primary Gastroenterologist:  Dr. Arlyss Repress  Pre-Procedure History & Physical: HPI:  Molly Becker is a 42 y.o. female is here for an colonoscopy.   Past Medical History:  Diagnosis Date   Medical history non-contributory     Past Surgical History:  Procedure Laterality Date   LEEP      Prior to Admission medications   Not on File    Allergies as of 06/21/2022 - Review Complete 06/21/2022  Allergen Reaction Noted   Penicillins Hives 09/19/2018    Family History  Problem Relation Age of Onset   Breast cancer Neg Hx     Social History   Socioeconomic History   Marital status: Single    Spouse name: Not on file   Number of children: Not on file   Years of education: Not on file   Highest education level: Not on file  Occupational History   Not on file  Tobacco Use   Smoking status: Former    Types: Cigarettes   Smokeless tobacco: Never  Vaping Use   Vaping Use: Never used  Substance and Sexual Activity   Alcohol use: Not Currently   Drug use: Not on file   Sexual activity: Not on file  Other Topics Concern   Not on file  Social History Narrative   Not on file   Social Determinants of Health   Financial Resource Strain: Not on file  Food Insecurity: Not on file  Transportation Needs: Not on file  Physical Activity: Not on file  Stress: Not on file  Social Connections: Not on file  Intimate Partner Violence: Not on file    Review of Systems: See HPI, otherwise negative ROS  Physical Exam: BP 100/73   Pulse 76   Temp 98.1 F (36.7 C) (Temporal)   Resp 18   Ht 5\' 11"  (1.803 m)   Wt 112 kg   SpO2 97%   BMI 34.45 kg/m  General:   Alert,  pleasant and cooperative in NAD Head:  Normocephalic and atraumatic. Neck:  Supple; no masses or  thyromegaly. Lungs:  Clear throughout to auscultation.    Heart:  Regular rate and rhythm. Abdomen:  Soft, nontender and nondistended. Normal bowel sounds, without guarding, and without rebound.   Neurologic:  Alert and  oriented x4;  grossly normal neurologically.  Impression/Plan: Molly Becker is here for an colonoscopy to be performed for rectal bleeding  Risks, benefits, limitations, and alternatives regarding  colonoscopy have been reviewed with the patient.  Questions have been answered.  All parties agreeable.   Lannette Donath, MD  06/29/2022, 8:17 AM

## 2022-06-29 NOTE — Anesthesia Preprocedure Evaluation (Signed)
Anesthesia Evaluation  Patient identified by MRN, date of birth, ID band Patient awake    Reviewed: Allergy & Precautions, H&P , NPO status , Patient's Chart, lab work & pertinent test results  Airway Mallampati: I  TM Distance: >3 FB Neck ROM: Full    Dental no notable dental hx.    Pulmonary neg pulmonary ROS, former smoker   Pulmonary exam normal breath sounds clear to auscultation       Cardiovascular negative cardio ROS Normal cardiovascular exam Rhythm:Regular Rate:Normal     Neuro/Psych negative neurological ROS  negative psych ROS   GI/Hepatic negative GI ROS, Neg liver ROS,,,  Endo/Other  negative endocrine ROS    Renal/GU negative Renal ROS  negative genitourinary   Musculoskeletal negative musculoskeletal ROS (+)    Abdominal   Peds negative pediatric ROS (+)  Hematology negative hematology ROS (+)   Anesthesia Other Findings   Reproductive/Obstetrics negative OB ROS                             Anesthesia Physical Anesthesia Plan  ASA: 1  Anesthesia Plan:    Post-op Pain Management:    Induction:   PONV Risk Score and Plan:   Airway Management Planned:   Additional Equipment:   Intra-op Plan:   Post-operative Plan:   Informed Consent:   Plan Discussed with:   Anesthesia Plan Comments:        Anesthesia Quick Evaluation

## 2022-06-29 NOTE — Anesthesia Postprocedure Evaluation (Signed)
Anesthesia Post Note  Patient: Engineer, drilling  Procedure(s) Performed: COLONOSCOPY WITH PROPOFOL (Rectum)  Patient location during evaluation: PACU Anesthesia Type: General Level of consciousness: awake and alert Pain management: pain level controlled Vital Signs Assessment: post-procedure vital signs reviewed and stable Respiratory status: spontaneous breathing, nonlabored ventilation, respiratory function stable and patient connected to nasal cannula oxygen Cardiovascular status: blood pressure returned to baseline and stable Postop Assessment: no apparent nausea or vomiting Anesthetic complications: no   No notable events documented.   Last Vitals:  Vitals:   06/29/22 0945 06/29/22 0951  BP: 106/84 107/77  Pulse: 97 86  Resp: 13 (!) 25  Temp:  (!) 36.3 C  SpO2: 98% 99%    Last Pain:  Vitals:   06/29/22 0951  TempSrc:   PainSc: 0-No pain                 Marisue Humble

## 2022-06-29 NOTE — Telephone Encounter (Signed)
-----   Message from Toney Reil, MD sent at 06/29/2022  9:44 AM EDT ----- Regarding: Linzess She states, linzess works well for her, please send in prescription  RV

## 2022-06-29 NOTE — Op Note (Signed)
[facility] Gastroenterology Patient Name: Molly Becker Procedure Date: 06/29/2022 9:09 AM MRN: [id number] Account #: [id number] Date of Birth: 04-18-1980 Admit Type: Outpatient Age: 41 Room: [facility] OR ROOM 01 Gender: Female Note Status: Finalized Instrument Name: [id number] Procedure:             Colonoscopy Indications:           This is the patient's first colonoscopy, Rectal                         bleeding Providers:             Toney Reil MD, MD Medicines:             General Anesthesia Complications:         No immediate complications. Estimated blood loss: None. Procedure:             Pre-Anesthesia Assessment:                        - Prior to the procedure, a History and Physical was                         performed, and patient medications and allergies were                         reviewed. The patient is competent. The risks and                         benefits of the procedure and the sedation options and                         risks were discussed with the patient. All questions                         were answered and informed consent was obtained.                         Patient identification and proposed procedure were                         verified by the physician, the nurse, the                         anesthesiologist, the anesthetist and the technician                         in the pre-procedure area in the procedure room in the                         endoscopy suite. Mental Status Examination: alert and                         oriented. Airway Examination: normal oropharyngeal                         airway and neck mobility. Respiratory Examination:                         clear to auscultation. CV Examination: normal.  Prophylactic Antibiotics: The patient does not require                         prophylactic antibiotics. Prior Anticoagulants: The                         patient has taken no  anticoagulant or antiplatelet                         agents. ASA Grade Assessment: I - A normal, healthy                         patient. After reviewing the risks and benefits, the                         patient was deemed in satisfactory condition to                         undergo the procedure. The anesthesia plan was to use                         general anesthesia. Immediately prior to                         administration of medications, the patient was                         re-assessed for adequacy to receive sedatives. The                         heart rate, respiratory rate, oxygen saturations,                         blood pressure, adequacy of pulmonary ventilation, and                         response to care were monitored throughout the                         procedure. The physical status of the patient was                         re-assessed after the procedure.                        After obtaining informed consent, the colonoscope was                         passed under direct vision. Throughout the procedure,                         the patient's blood pressure, pulse, and oxygen                         saturations were monitored continuously. The                         Colonoscope was introduced through the anus and  advanced to the the terminal ileum, with                         identification of the appendiceal orifice and IC                         valve. The colonoscopy was performed without                         difficulty. The patient tolerated the procedure well.                         The quality of the bowel preparation was evaluated                         using the BBPS [facility] Bowel Preparation Scale) with                         scores of: Right Colon = 3, Transverse Colon = 3 and                         Left Colon = 3 (entire mucosa seen well with no                         residual staining, small fragments of stool or  opaque                         liquid). The total BBPS score equals 9. The terminal                         ileum, ileocecal valve, appendiceal orifice, and                         rectum were photographed. Findings:      The perianal and digital rectal examinations were normal. Pertinent       negatives include normal sphincter tone and no palpable rectal lesions.      The entire examined colon appeared normal.      Non-bleeding external hemorrhoids were found during retroflexion. The       hemorrhoids were large.      The terminal ileum appeared normal. Impression:            - The entire examined colon is normal.                        - Non-bleeding external hemorrhoids.                        - The examined portion of the ileum was normal.                        - No specimens collected. Recommendation:        - Discharge patient to home (with escort).                        - Resume previous diet today.                        -  Continue present medications.                        - Repeat colonoscopy in 10 years for screening                         purposes. Procedure Code(s):     --- Professional ---                        [phone], Colonoscopy, flexible; diagnostic, including                         collection of specimen(s) by brushing or washing, when                         performed (separate procedure) Diagnosis Code(s):     --- Professional ---                        K64.4, Residual hemorrhoidal skin tags                        K62.5, Hemorrhage of anus and rectum CPT copyright 2022 American Medical Association. All rights reserved. The codes documented in this report are preliminary and upon coder review may  be revised to meet current compliance requirements. Dr. Libby Maw Toney Reil MD, MD 06/29/2022 9:40:15 AM This report has been signed electronically. Number of Addenda: 0 Note Initiated On: 06/29/2022 9:09 AM Scope Withdrawal Time: 0 hours 9 minutes 17  seconds  Total Procedure Duration: 0 hours 13 minutes 4 seconds  Estimated Blood Loss:  Estimated blood loss: none. Estimated blood loss: none.      [facility]

## 2022-06-29 NOTE — Telephone Encounter (Signed)
Sent medication to the pharmacy  

## 2022-06-29 NOTE — Transfer of Care (Signed)
Immediate Anesthesia Transfer of Care Note  Patient: Molly Becker  Procedure(s) Performed: COLONOSCOPY WITH PROPOFOL (Rectum)  Patient Location: PACU  Anesthesia Type: No value filed.  Level of Consciousness: awake, alert  and patient cooperative  Airway and Oxygen Therapy: Patient Spontanous Breathing and Patient connected to supplemental oxygen  Post-op Assessment: Post-op Vital signs reviewed, Patient's Cardiovascular Status Stable, Respiratory Function Stable, Patent Airway and No signs of Nausea or vomiting  Post-op Vital Signs: Reviewed and stable  Complications: No notable events documented.

## 2022-06-30 ENCOUNTER — Encounter: Payer: Self-pay | Admitting: Gastroenterology

## 2022-09-19 NOTE — Progress Notes (Deleted)
    Celso Amy, PA-C [address]  [address]  [place], [place] [address]  Main: [phone]  Fax: [phone]   Primary Care Physician: Lorn Junes, FNP  Primary Gastroenterologist:  ***  CC: F/U Constipation  HPI: Molly Becker is a 42 y.o. female  Current Outpatient Medications  Medication Sig Dispense Refill   linaclotide (LINZESS) 290 MCG CAPS capsule Take 1 capsule (290 mcg total) by mouth daily before breakfast. 30 capsule 5   No current facility-administered medications for this visit.    Allergies as of 09/20/2022 - Review Complete 06/29/2022  Allergen Reaction Noted   Penicillins Hives 09/19/2018    Past Medical History:  Diagnosis Date   Medical history non-contributory     Past Surgical History:  Procedure Laterality Date   COLONOSCOPY WITH PROPOFOL N/A 06/29/2022   Procedure: COLONOSCOPY WITH PROPOFOL;  Surgeon: Toney Reil, MD;  [facility];  Service: Endoscopy;  Laterality: N/A;   LEEP      Review of Systems:    All systems reviewed and negative except where noted in HPI.   Physical Examination:   There were no vitals taken for this visit.  General: Well-nourished, well-developed in no acute distress.  Lungs: Clear to auscultation bilaterally. Non-labored. Heart: Regular rate and rhythm, no murmurs rubs or gallops.  Abdomen: Bowel sounds are normal; Abdomen is Soft; No hepatosplenomegaly, masses or hernias;  No Abdominal Tenderness; No guarding or rebound tenderness. Neuro: Alert and oriented x 3.  Grossly intact.  Psych: Alert and cooperative, normal mood and affect.   Imaging Studies: No results found.  Assessment and Plan:   Molly Becker is a 42 y.o. y/o female ***    Celso Amy, PA-C  Follow up ***  BP check ***

## 2022-09-20 ENCOUNTER — Ambulatory Visit: Payer: Medicaid Other | Admitting: Physician Assistant

## 2022-09-20 ENCOUNTER — Encounter: Payer: Self-pay | Admitting: Physician Assistant

## 2023-01-16 ENCOUNTER — Ambulatory Visit
Admission: RE | Admit: 2023-01-16 | Discharge: 2023-01-16 | Disposition: A | Discharge status: Home / Self Care | Payer: Medicaid Other | Source: Ambulatory Visit | Attending: Emergency Medicine | Admitting: Emergency Medicine

## 2023-01-16 VITALS — BP 119/77 | HR 112 | Temp 100.3°F | Resp 18

## 2023-01-16 DIAGNOSIS — B349 Viral infection, unspecified: Secondary | ICD-10-CM | POA: Diagnosis not present

## 2023-01-16 LAB — POC COVID19/FLU A&B COMBO
Covid Antigen, POC: NEGATIVE
Influenza A Antigen, POC: NEGATIVE
Influenza B Antigen, POC: NEGATIVE

## 2023-01-16 MED ORDER — ACETAMINOPHEN 325 MG PO TABS
650.0000 mg | ORAL_TABLET | Freq: Once | ORAL | Status: AC
  Administered 2023-01-16: 650 mg via ORAL

## 2023-01-16 NOTE — ED Triage Notes (Signed)
Patient to Urgent Care with complaints of nasal congestion/ chills/ fatigue/ cough/ headaches/ shortness of breath. Reports temps around 100-101.   Symptoms started Sunday. Reports Monday had a sudden decrease in energy.   Taking tylenol/ otc decongestant/ claritin.

## 2023-01-16 NOTE — Discharge Instructions (Addendum)
 The COVID and flu tests are negative.   Take Tylenol or ibuprofen as needed for fever or discomfort.  Take plain Mucinex as needed for congestion.  Rest and keep yourself hydrated.    Follow-up with your primary care provider if your symptoms are not improving.

## 2023-01-16 NOTE — ED Provider Notes (Signed)
Molly Becker    CSN: [id number] Arrival date & time: 01/16/23  1600      History   Chief Complaint Chief Complaint  Patient presents with   Chills    i AM EXTREMELY SICK, JUST DONT FEEL GOOD. FEEL LIKE I HAVE A SINUS COLD, NO ENERGY - Entered by patient    HPI Molly Becker is a 42 y.o. female.  Patient presents with fever, chills, fatigue, headache, congestion, cough, shortness of breath x 2 days.  Treatment today with decongestant.  She took Tylenol yesterday.  No rash, vomiting, diarrhea, or other symptoms.  She denies pertinent medical history.  The history is provided by the patient and medical records.    Past Medical History:  Diagnosis Date   Medical history non-contributory     Patient Active Problem List   Diagnosis Date Noted   Rectal bleeding 06/29/2022    Past Surgical History:  Procedure Laterality Date   COLONOSCOPY WITH PROPOFOL N/A 06/29/2022   Procedure: COLONOSCOPY WITH PROPOFOL;  Surgeon: Toney Reil, MD;  Location: [facility];  Service: Endoscopy;  Laterality: N/A;   LEEP      OB History   No obstetric history on file.      Home Medications    Prior to Admission medications   Medication Sig Start Date End Date Taking? Authorizing Provider  linaclotide Karlene Einstein) 290 MCG CAPS capsule Take 1 capsule (290 mcg total) by mouth daily before breakfast. 06/29/22   Vanga, Loel Dubonnet, MD    Family History Family History  Problem Relation Age of Onset   Breast cancer Neg Hx     Social History Social History   Tobacco Use   Smoking status: Former    Types: Cigarettes   Smokeless tobacco: Never  Vaping Use   Vaping status: Never Used  Substance Use Topics   Alcohol use: Not Currently     Allergies   Penicillins   Review of Systems Review of Systems  Constitutional:  Positive for chills, fatigue and fever.  HENT:  Negative for ear pain and sore throat.   Respiratory:  Positive for cough and shortness of  breath.   Cardiovascular:  Negative for chest pain and palpitations.  Gastrointestinal:  Negative for diarrhea and vomiting.  Neurological:  Positive for headaches.     Physical Exam Triage Vital Signs ED Triage Vitals  Encounter Vitals Group     BP 01/16/23 1657 119/77     Systolic BP Percentile --      Diastolic BP Percentile --      Pulse Rate 01/16/23 1651 (!) 112     Resp 01/16/23 1651 18     Temp 01/16/23 1651 100.3 F (37.9 C)     Temp src --      SpO2 01/16/23 1651 97 %     Weight --      Height --      Head Circumference --      Peak Flow --      Pain Score 01/16/23 1647 8     Pain Loc --      Pain Education --      Exclude from Growth Chart --    No data found.  Updated Vital Signs BP 119/77   Pulse (!) 112   Temp 100.3 F (37.9 C)   Resp 18   LMP 01/07/2023   SpO2 97%   Visual Acuity Right Eye Distance:   Left Eye Distance:   Bilateral Distance:  Right Eye Near:   Left Eye Near:    Bilateral Near:     Physical Exam Vitals and nursing note reviewed.  Constitutional:      General: She is not in acute distress.    Appearance: She is well-developed. She is ill-appearing.  HENT:     Right Ear: Tympanic membrane normal.     Left Ear: Tympanic membrane normal.     Nose: Nose normal.     Mouth/Throat:     Mouth: Mucous membranes are moist.     Pharynx: Oropharynx is clear.  Cardiovascular:     Rate and Rhythm: Normal rate and regular rhythm.     Heart sounds: Normal heart sounds.  Pulmonary:     Effort: Pulmonary effort is normal. No respiratory distress.     Breath sounds: Normal breath sounds.  Abdominal:     Tenderness: There is no abdominal tenderness.  Musculoskeletal:     Cervical back: Neck supple.  Skin:    General: Skin is warm and dry.  Neurological:     Mental Status: She is alert.      UC Treatments / Results  Labs (all labs ordered are listed, but only abnormal results are displayed) Labs Reviewed  POC COVID19/FLU  A&B COMBO    EKG   Radiology No results found.  Procedures Procedures (including critical care time)  Medications Ordered in UC Medications  acetaminophen (TYLENOL) tablet 650 mg (650 mg Oral Given 01/16/23 1702)    Initial Impression / Assessment and Plan / UC Course  I have reviewed the triage vital signs and the nursing notes.  Pertinent labs & imaging results that were available during my care of the patient were reviewed by me and considered in my medical decision making (see chart for details).    Viral illness.  Rapid COVID and flu negative.  Discussed symptomatic treatment including Tylenol or ibuprofen as needed for fever or discomfort, plain Mucinex as needed for congestion, rest, hydration.  Instructed patient to follow-up with PCP if not improving.  ED precautions given.  Patient agrees to plan of care.   Final Clinical Impressions(s) / UC Diagnoses   Final diagnoses:  Viral illness     Discharge Instructions      The COVID and flu tests are negative.   Take Tylenol or ibuprofen as needed for fever or discomfort.  Take plain Mucinex as needed for congestion.  Rest and keep yourself hydrated.    Follow-up with your primary care provider if your symptoms are not improving.         ED Prescriptions   None    PDMP not reviewed this encounter.   Mickie Bail, NP 01/16/23 [id number]

## 2023-04-23 ENCOUNTER — Other Ambulatory Visit: Payer: Self-pay | Admitting: Family Medicine

## 2023-04-23 DIAGNOSIS — Z1231 Encounter for screening mammogram for malignant neoplasm of breast: Secondary | ICD-10-CM

## 2023-05-28 ENCOUNTER — Ambulatory Visit
Admission: RE | Admit: 2023-05-28 | Discharge: 2023-05-28 | Disposition: A | Discharge status: Home / Self Care | Source: Ambulatory Visit | Attending: Family Medicine | Admitting: Family Medicine

## 2023-05-28 DIAGNOSIS — Z1231 Encounter for screening mammogram for malignant neoplasm of breast: Secondary | ICD-10-CM | POA: Diagnosis present

## 2024-01-26 IMAGING — MG MM DIGITAL SCREENING BILAT W/ TOMO AND CAD
8 series · 8 of 24 positions shown · non-contrast
Comparison: Previous exam(s).

CLINICAL DATA: Screening.

EXAM:
DIGITAL SCREENING BILATERAL MAMMOGRAM WITH TOMOSYNTHESIS AND CAD
TECHNIQUE: Bilateral screening digital craniocaudal and mediolateral oblique
mammograms were obtained. Bilateral screening digital breast
tomosynthesis was performed. The images were evaluated with
computer-aided detection.

[R CC synth-2D]
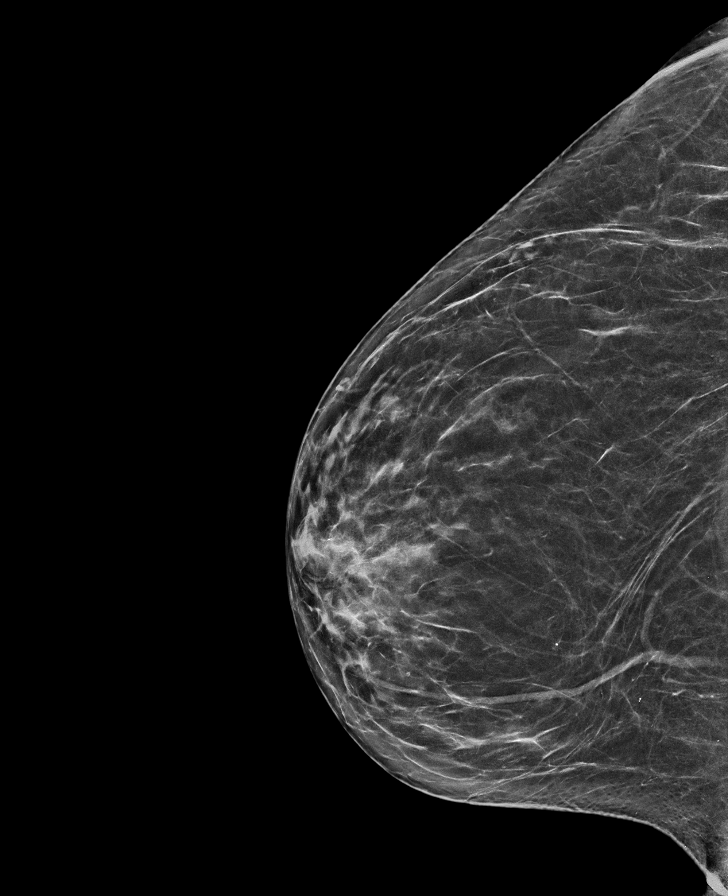

[R MLO synth-2D]
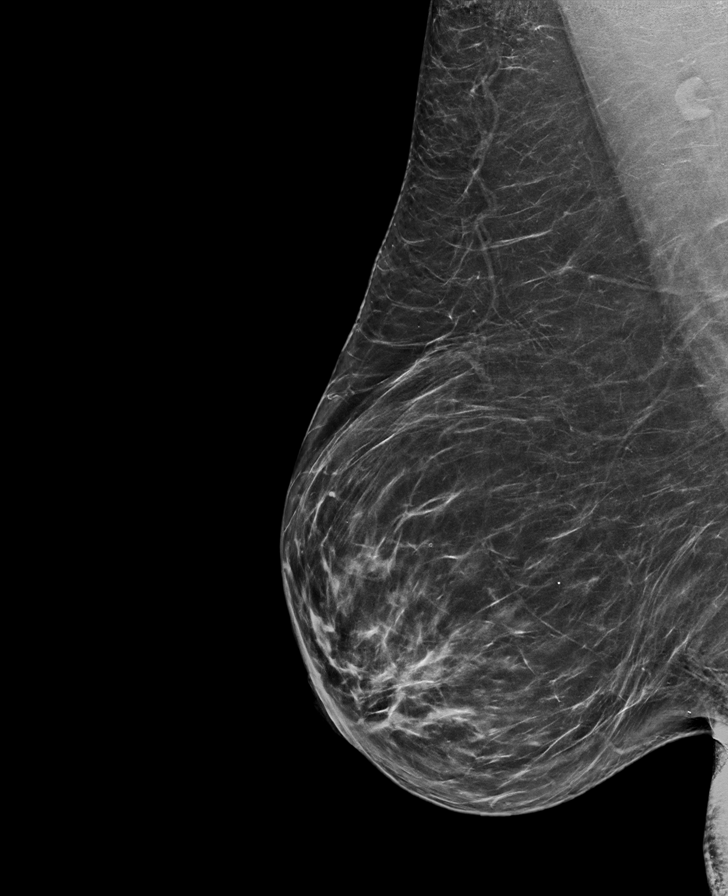

[L CC synth-2D]
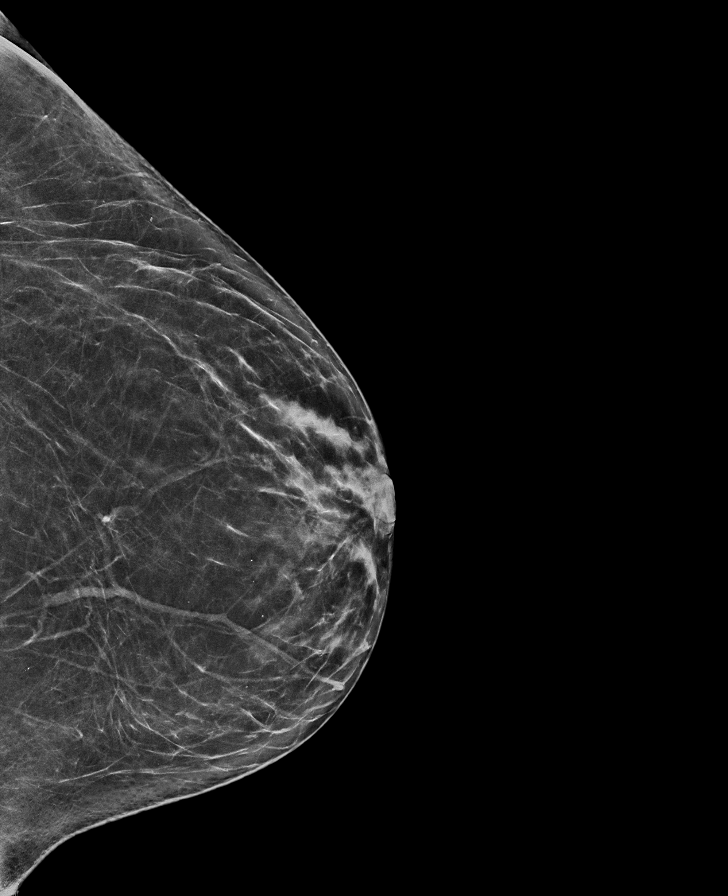

[L MLO synth-2D]
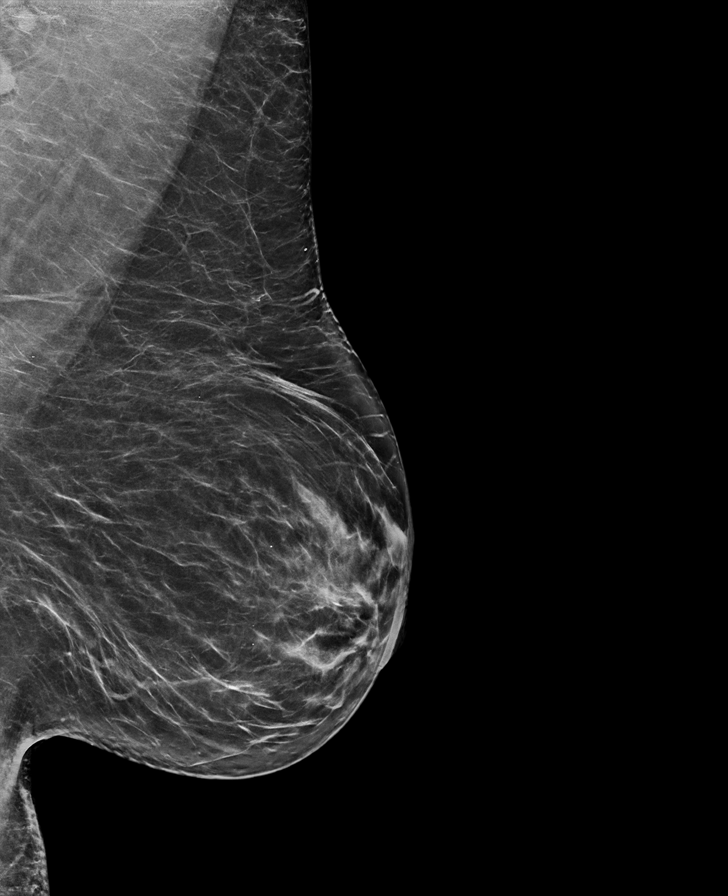

[L CC tomo · tomo slice 37/73.0]
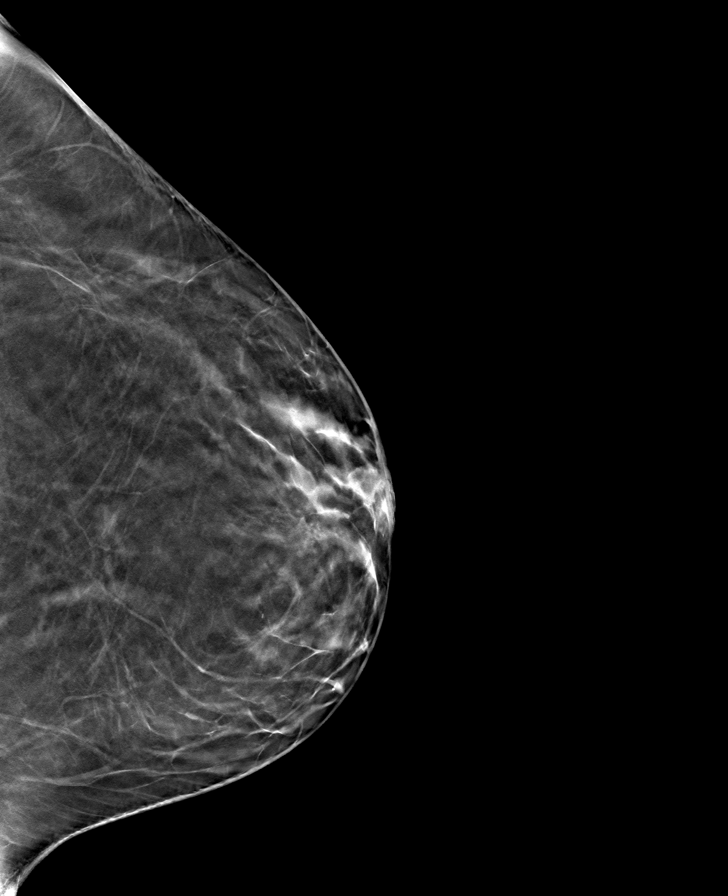

[R MLO tomo · tomo slice 40/79.0]
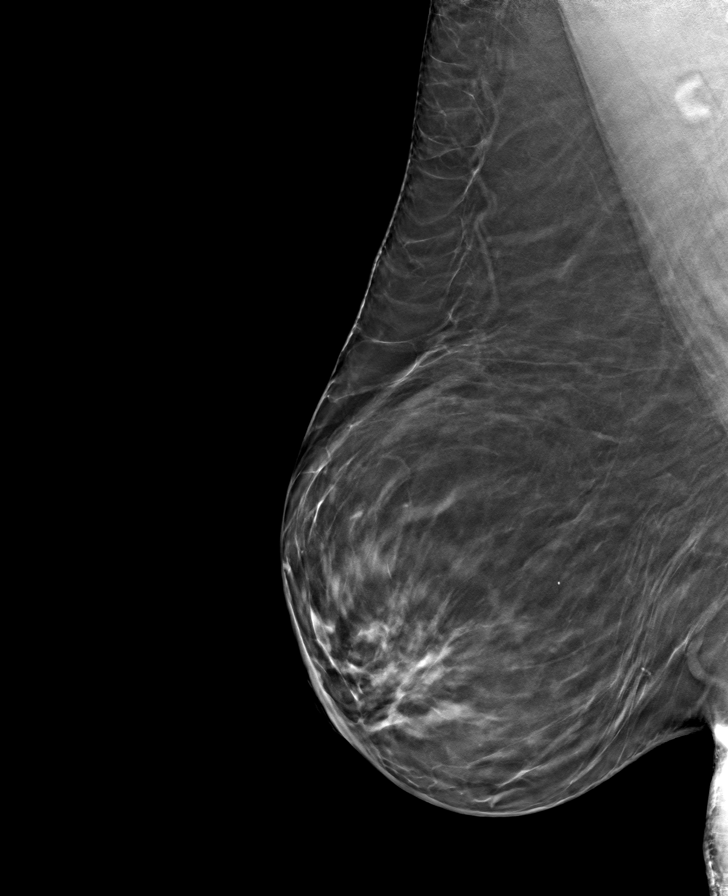

[L MLO tomo · tomo slice 39/78.0]
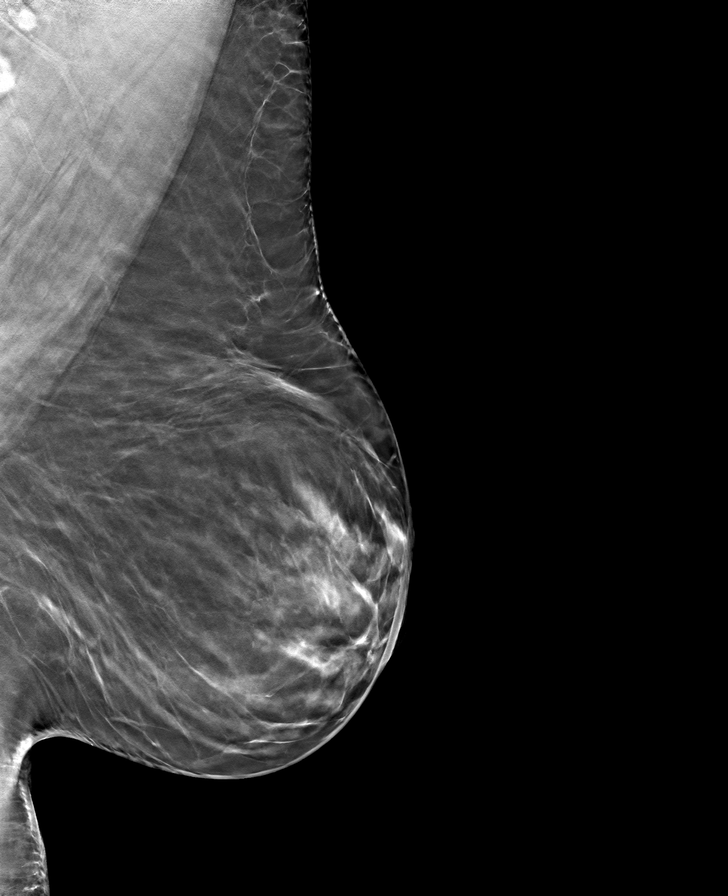

[R CC tomo · tomo slice 35/70.0]
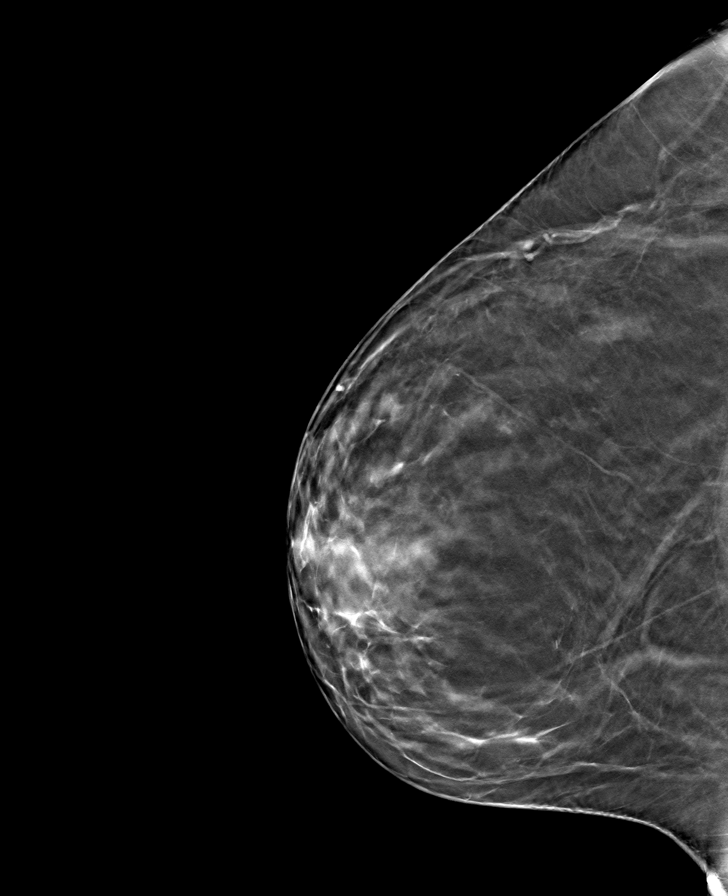

[8 of 24 positions shown; findings below may reference images not displayed]

ACR Breast Density Category b: There are scattered areas of
fibroglandular density.
FINDINGS: There are no findings suspicious for malignancy.
IMPRESSION: No mammographic evidence of malignancy. A result letter of this
screening mammogram will be mailed directly to the patient.

RECOMMENDATION:
Screening mammogram in one year. (Code:51-O-LD2)

BI-RADS CATEGORY  1: Negative.
